# Patient Record
Sex: Male | Born: 1965 | Race: White | Hispanic: No | Marital: Married
Health system: Midwestern US, Academic
[De-identification: ages and names within clinical notes are randomized; demographics above are authoritative.]

---

## 2016-06-23 MED ORDER — AZATHIOPRINE 50 MG PO TAB
ORAL_TABLET | Freq: Every day | 3 refills | Status: DC
Start: 2016-06-23 — End: 2016-10-26

## 2016-07-01 MED ORDER — ADALIMUMAB 40 MG/0.8 ML SC PNKT
40 mg | INJECTION | SUBCUTANEOUS | 5 refills | Status: DC
Start: 2016-07-01 — End: 2016-07-24

## 2016-07-01 MED ORDER — SODIUM CHLORIDE 0.9 % IV SOLP
INTRAVENOUS | 0 refills | Status: CN
Start: 2016-07-01 — End: ?

## 2016-07-01 MED ORDER — PEG-ELECTROLYTE SOLN 420 GRAM PO SOLR
0 refills | Status: DC
Start: 2016-07-01 — End: 2017-01-11

## 2016-07-02 ENCOUNTER — Ambulatory Visit: Admit: 2016-09-01 | Discharge: 2016-09-01 | Payer: BC Managed Care – PPO

## 2016-07-24 MED ORDER — ADALIMUMAB 40 MG/0.8 ML SC PNKT
40 mg | INJECTION | SUBCUTANEOUS | 4 refills | Status: DC
Start: 2016-07-24 — End: 2016-08-04

## 2016-08-04 MED ORDER — ADALIMUMAB 40 MG/0.8 ML SC PNKT
40 mg | INJECTION | SUBCUTANEOUS | 4 refills | Status: DC
Start: 2016-08-04 — End: 2016-12-31

## 2016-08-11 ENCOUNTER — Encounter: Admit: 2016-08-11 | Discharge: 2016-08-11 | Payer: BC Managed Care – PPO

## 2016-09-01 ENCOUNTER — Ambulatory Visit: Admit: 2016-09-01 | Discharge: 2016-09-01 | Payer: BC Managed Care – PPO

## 2016-09-01 ENCOUNTER — Encounter: Admit: 2016-09-01 | Discharge: 2016-09-01 | Payer: BC Managed Care – PPO

## 2016-09-01 DIAGNOSIS — K6389 Other specified diseases of intestine: ICD-10-CM

## 2016-09-01 DIAGNOSIS — Z87891 Personal history of nicotine dependence: ICD-10-CM

## 2016-09-01 DIAGNOSIS — Z980 Intestinal bypass and anastomosis status: ICD-10-CM

## 2016-09-01 DIAGNOSIS — K626 Ulcer of anus and rectum: ICD-10-CM

## 2016-09-01 DIAGNOSIS — K5000 Crohn's disease of small intestine without complications: ICD-10-CM

## 2016-09-01 DIAGNOSIS — K30 Functional dyspepsia: ICD-10-CM

## 2016-09-01 DIAGNOSIS — B029 Zoster without complications: ICD-10-CM

## 2016-09-01 DIAGNOSIS — IMO0002 Abdominal abscess: ICD-10-CM

## 2016-09-01 DIAGNOSIS — R634 Abnormal weight loss: ICD-10-CM

## 2016-09-01 DIAGNOSIS — Z8719 Personal history of other diseases of the digestive system: ICD-10-CM

## 2016-09-01 DIAGNOSIS — K210 Gastro-esophageal reflux disease with esophagitis: ICD-10-CM

## 2016-09-01 DIAGNOSIS — R109 Unspecified abdominal pain: ICD-10-CM

## 2016-09-01 DIAGNOSIS — K449 Diaphragmatic hernia without obstruction or gangrene: Principal | ICD-10-CM

## 2016-09-01 DIAGNOSIS — K5080 Crohn's disease of both small and large intestine without complications: Principal | ICD-10-CM

## 2016-09-01 DIAGNOSIS — E538 Deficiency of other specified B group vitamins: ICD-10-CM

## 2016-09-01 MED ORDER — LACTATED RINGERS IV SOLP
Freq: Once | INTRAVENOUS | 0 refills | Status: DC
Start: 2016-09-01 — End: 2016-09-01

## 2016-09-01 MED ORDER — PROMETHAZINE 25 MG/ML IJ SOLN
6.25 mg | INTRAVENOUS | 0 refills | Status: CN | PRN
Start: 2016-09-01 — End: ?

## 2016-09-01 MED ORDER — PROPOFOL 10 MG/ML IV EMUL 20 ML (INFUSION)(AM)(OR)
INTRAVENOUS | 0 refills | Status: DC
Start: 2016-09-01 — End: 2016-09-01
  Administered 2016-09-01: 18:00:00 120 ug/kg/min via INTRAVENOUS

## 2016-09-01 MED ORDER — MESALAMINE 1,000 MG RE SUPP
1000 mg | Freq: Every evening | RECTAL | 5 refills | 30.00000 days | Status: AC
Start: 2016-09-01 — End: 2018-01-04

## 2016-09-01 MED ORDER — LIDOCAINE (PF) 200 MG/10 ML (2 %) IJ SYRG
0 refills | Status: DC
Start: 2016-09-01 — End: 2016-09-01
  Administered 2016-09-01: 18:00:00 60 mg via INTRAVENOUS

## 2016-09-01 MED ORDER — LACTATED RINGERS IV SOLP
0 refills | Status: DC
Start: 2016-09-01 — End: 2016-09-01
  Administered 2016-09-01: 17:00:00 via INTRAVENOUS

## 2016-09-01 MED ORDER — HYDROCODONE-ACETAMINOPHEN 5-325 MG PO TAB
1-2 | Freq: Once | ORAL | 0 refills | Status: CN | PRN
Start: 2016-09-01 — End: ?

## 2016-09-01 MED ORDER — FENTANYL CITRATE (PF) 50 MCG/ML IJ SOLN
50 ug | INTRAVENOUS | 0 refills | Status: CN | PRN
Start: 2016-09-01 — End: ?

## 2016-09-01 MED ORDER — PROPOFOL INJ 10 MG/ML IV VIAL
0 refills | Status: DC
Start: 2016-09-01 — End: 2016-09-01
  Administered 2016-09-01 (×2): 20 mg via INTRAVENOUS
  Administered 2016-09-01: 18:00:00 60 mg via INTRAVENOUS

## 2016-09-01 NOTE — Telephone Encounter
Need to get him back to see Dr. Celedonio Savage for anal verge ulceration, history Crohn's.  She has seen him before.  I also sent RX for Canasa suppository nightly.  Thanks

## 2016-09-01 NOTE — Anesthesia Post-Procedure Evaluation
Post-Anesthesia Evaluation    Name: Tanner Taylor      MRN: [id number]     DOB: 1965-10-12     Age: 51 y.o.     Sex: male   __________________________________________________________________________     Procedure Date: 09/01/2016  Procedure: Procedure(s):  COLONOSCOPY & EGD regarding GERD & Crohn's  ESOPHAGOGASTRODUODENOSCOPY & Colonoscopy regarding GERD & Crohn's  COLONOSCOPY BIOPSY      Surgeon: Surgeon(s):  Tim Lair, MD    Post-Anesthesia Vitals          Post Anesthesia Evaluation Note    Evaluation location: pre/post  Patient participation: recovered; patient participated in evaluation  Level of consciousness: alert    Pain score: 0  Pain management: adequate    Hydration: normovolemia  Temperature: 36.0C - 38.4C  Airway patency: adequate    Perioperative Events  Perioperative events:  no      Postoperative Status  Cardiovascular status: hemodynamically stable  Respiratory status: spontaneous ventilation  Follow-up needed: none      Perioperative Events  Perioperative Event: No  Emergency Case Activation: No

## 2016-09-01 NOTE — Telephone Encounter
Referral to Dr. Celedonio Savage regarding anal verge ulceration with h/o Crohn's.  Patient has been seen in the past by Dr. Celedonio Savage.  Colonoscopy/EGD report sent to Dr. Alric Seton O'Brien's office.  Please call patient for office visit.

## 2016-09-01 NOTE — Telephone Encounter
Spoke with Dr. Alric Seton O'Brien's Office and they will contact patient regarding appointment.

## 2016-09-03 ENCOUNTER — Encounter: Admit: 2016-09-03 | Discharge: 2016-09-03 | Payer: BC Managed Care – PPO

## 2016-09-03 DIAGNOSIS — K449 Diaphragmatic hernia without obstruction or gangrene: Principal | ICD-10-CM

## 2016-09-03 DIAGNOSIS — IMO0002 Abdominal abscess: ICD-10-CM

## 2016-09-03 DIAGNOSIS — K6389 Other specified diseases of intestine: ICD-10-CM

## 2016-09-03 DIAGNOSIS — R109 Unspecified abdominal pain: ICD-10-CM

## 2016-09-03 DIAGNOSIS — R634 Abnormal weight loss: ICD-10-CM

## 2016-09-03 DIAGNOSIS — B029 Zoster without complications: ICD-10-CM

## 2016-09-03 DIAGNOSIS — E538 Deficiency of other specified B group vitamins: ICD-10-CM

## 2016-09-03 DIAGNOSIS — K5000 Crohn's disease of small intestine without complications: ICD-10-CM

## 2016-09-03 DIAGNOSIS — K210 Gastro-esophageal reflux disease with esophagitis: ICD-10-CM

## 2016-09-24 ENCOUNTER — Encounter: Admit: 2016-09-24 | Discharge: 2016-09-24 | Payer: BC Managed Care – PPO

## 2016-09-24 LAB — CBC AND DIFF
Lab: 0.1 10*3/uL (ref 0.0–0.2)
Lab: 0.1 10*3/uL (ref 0.0–0.7)
Lab: 0.6 10*3/uL (ref 0.0–0.9)
Lab: 0.7 % (ref 0.00–2.50)
Lab: 0.8 % (ref 0.0–7.0)
Lab: 1 10*3/uL (ref 0.60–3.40)
Lab: 12 % (ref 11.6–14.8)
Lab: 13 % — AB (ref 10.0–50.0)
Lab: 13 g/dL — AB (ref 14.0–17.0)
Lab: 246 10*3/uL (ref 150–400)
Lab: 30 pg (ref 27.0–31.2)
Lab: 34 g/dL (ref 32.0–36.0)
Lab: 4.5 M/uL (ref 4.20–5.40)
Lab: 40 % — AB (ref 42.0–52.0)
Lab: 5.6 10*3/uL (ref 2.00–6.90)
Lab: 7.3 10*3/uL (ref 5.00–10.00)
Lab: 76 % — AB (ref 37.0–80.0)
Lab: 8.1 % (ref 0.0–12.0)
Lab: 9.8 fL (ref 7.4–10.0)
Lab: 90 fL (ref 80.0–97.0)

## 2016-10-02 ENCOUNTER — Encounter: Admit: 2016-10-02 | Discharge: 2016-10-02 | Payer: BC Managed Care – PPO

## 2016-10-23 ENCOUNTER — Encounter: Admit: 2016-10-23 | Discharge: 2016-10-23 | Payer: BC Managed Care – PPO

## 2016-10-26 MED ORDER — AZATHIOPRINE 50 MG PO TAB
ORAL_TABLET | Freq: Every day | 3 refills | Status: AC
Start: 2016-10-26 — End: 2017-03-01

## 2016-10-27 ENCOUNTER — Encounter: Admit: 2016-10-27 | Discharge: 2016-10-27 | Payer: BC Managed Care – PPO

## 2016-10-28 ENCOUNTER — Encounter: Admit: 2016-10-28 | Discharge: 2016-10-28 | Payer: BC Managed Care – PPO

## 2016-10-28 LAB — COMPREHENSIVE METABOLIC PANEL
Lab: 103 mmol/L (ref 95–114)
Lab: 138 mmol/L (ref 134–148)
Lab: 4.1 mmol/L (ref 3.5–5.3)

## 2016-10-28 NOTE — Progress Notes
This encounter was created in error. Please disregard.

## 2016-12-08 ENCOUNTER — Encounter: Admit: 2016-12-08 | Discharge: 2016-12-08 | Payer: BC Managed Care – PPO

## 2016-12-08 DIAGNOSIS — Z5181 Encounter for therapeutic drug level monitoring: ICD-10-CM

## 2016-12-08 DIAGNOSIS — K50919 Crohn's disease, unspecified, with unspecified complications: Principal | ICD-10-CM

## 2016-12-08 LAB — CBC AND DIFF
Lab: 0.1 10*3/uL (ref 0.0–0.2)
Lab: 0.3 10*3/uL (ref 0.0–0.7)
Lab: 0.8 % (ref 0.00–2.50)
Lab: 1 10*3/uL — AB (ref 0.0–0.9)
Lab: 10 fL (ref 7.4–10.0)
Lab: 11 % (ref 0.0–12.0)
Lab: 12 % (ref 11.6–14.8)
Lab: 14 g/dL (ref 14.0–17.0)
Lab: 22 % (ref 10.0–50.0)
Lab: 227 10*3/uL (ref 150–400)
Lab: 3 % (ref 0.0–7.0)
Lab: 30 pg (ref 27.0–31.2)
Lab: 33 g/dL (ref 32.0–36.0)
Lab: 4.7 m/uL (ref 4.20–5.40)
Lab: 42 % (ref 42.0–52.0)
Lab: 5.6 10*3/uL (ref 2.00–6.90)
Lab: 62 % (ref 37.0–80.0)
Lab: 8.9 10*3/uL (ref 5.00–10.00)
Lab: 90 fL (ref 80.0–97.0)

## 2016-12-08 NOTE — Telephone Encounter
New standing CBC w/diff and CMP orders faxed to [facility]. [phone]

## 2016-12-31 ENCOUNTER — Encounter: Admit: 2016-12-31 | Discharge: 2016-12-31 | Payer: BC Managed Care – PPO

## 2016-12-31 MED ORDER — ADALIMUMAB 40 MG/0.8 ML SC PNKT
40 mg | INJECTION | SUBCUTANEOUS | 3 refills | Status: AC
Start: 2016-12-31 — End: 2017-08-05

## 2017-01-01 ENCOUNTER — Encounter: Admit: 2017-01-01 | Discharge: 2017-01-01 | Payer: BC Managed Care – PPO

## 2017-01-01 DIAGNOSIS — Z5181 Encounter for therapeutic drug level monitoring: ICD-10-CM

## 2017-01-01 DIAGNOSIS — K50919 Crohn's disease, unspecified, with unspecified complications: Principal | ICD-10-CM

## 2017-01-01 LAB — CBC AND DIFF
Lab: 0.1 10*3/uL (ref 0.0–0.2)
Lab: 0.2 10*3/uL (ref 0.0–0.7)
Lab: 0.4 % (ref 0.00–2.50)
Lab: 1 10*3/uL — AB (ref 0.0–0.9)
Lab: 1.1 10*3/uL (ref 0.60–3.40)
Lab: 1.9 % (ref 0.0–7.0)
Lab: 10 % (ref 10.0–50.0)
Lab: 10 fL (ref 7.4–10.0)
Lab: 11 10*3/uL — AB (ref 5.00–10.00)
Lab: 13 % (ref 11.6–14.8)
Lab: 14 g/dL (ref 14.0–17.0)
Lab: 252 10*3/uL (ref 150–400)
Lab: 30 pg (ref 27.0–31.2)
Lab: 33 g/dL (ref 32.0–36.0)
Lab: 4.6 M/uL (ref 4.2–5.40)
Lab: 41 % — AB (ref 42.0–52.0)
Lab: 78 % (ref 37.0–80.0)
Lab: 8.9 % (ref 0.0–12.0)
Lab: 89 fL (ref 80.0–97.0)
Lab: 9.1 10*3/uL — AB (ref 2.00–6.90)

## 2017-01-11 ENCOUNTER — Encounter: Admit: 2017-01-11 | Discharge: 2017-01-11 | Payer: BC Managed Care – PPO

## 2017-01-11 ENCOUNTER — Ambulatory Visit: Admit: 2017-01-11 | Discharge: 2017-01-12 | Payer: BC Managed Care – PPO

## 2017-01-11 DIAGNOSIS — K50012 Crohn's disease of small intestine with intestinal obstruction: Principal | ICD-10-CM

## 2017-01-11 DIAGNOSIS — R109 Unspecified abdominal pain: ICD-10-CM

## 2017-01-11 DIAGNOSIS — IMO0002 Abdominal abscess: ICD-10-CM

## 2017-01-11 DIAGNOSIS — K210 Gastro-esophageal reflux disease with esophagitis: ICD-10-CM

## 2017-01-11 DIAGNOSIS — E538 Deficiency of other specified B group vitamins: ICD-10-CM

## 2017-01-11 DIAGNOSIS — R634 Abnormal weight loss: ICD-10-CM

## 2017-01-11 DIAGNOSIS — K6389 Other specified diseases of intestine: ICD-10-CM

## 2017-01-11 DIAGNOSIS — B029 Zoster without complications: ICD-10-CM

## 2017-01-11 DIAGNOSIS — K5000 Crohn's disease of small intestine without complications: ICD-10-CM

## 2017-01-11 DIAGNOSIS — K449 Diaphragmatic hernia without obstruction or gangrene: Principal | ICD-10-CM

## 2017-02-02 ENCOUNTER — Encounter: Admit: 2017-02-02 | Discharge: 2017-02-02 | Payer: BC Managed Care – PPO

## 2017-02-02 LAB — CBC AND DIFF
Lab: 0.1 10*3/uL (ref 0.0–0.2)
Lab: 0.4 10*3/uL (ref 0.0–0.7)
Lab: 0.5 % (ref 0.00–2.50)
Lab: 0.7 10*3/uL (ref 0.0–0.9)
Lab: 1.2 10*3/uL (ref 0.60–3.40)
Lab: 10 fL — AB (ref 7.4–10.0)
Lab: 13 % (ref 10.0–50.0)
Lab: 13 % (ref 11.6–14.8)
Lab: 14 g/dL (ref 14.0–17.0)
Lab: 264 10*3/uL (ref 150–400)
Lab: 3.8 % (ref 0.0–7.0)
Lab: 30 pg (ref 27.0–31.2)
Lab: 33 g/dL (ref 32.0–36.0)
Lab: 4.7 M/uL (ref 4.20–5.40)
Lab: 43 % (ref 42.0–52.0)
Lab: 6.9 10*3/uL — AB (ref 2.0–6.90)
Lab: 7.1 % (ref 0.0–12.0)
Lab: 75 % (ref 37.0–80.0)
Lab: 9.1 10*3/uL (ref 5.00–10.20)
Lab: 91 fL (ref 80.0–97.0)

## 2017-02-02 LAB — COMPREHENSIVE METABOLIC PANEL
Lab: 1.1 mg/dL (ref 0.50–1.50)
Lab: 10 mg/dL (ref 5–25)
Lab: 108 mmol/L (ref 95–114)
Lab: 143 mmol/L (ref 134–148)
Lab: 19 U/L (ref 2–40)
Lab: 22 U/L (ref 6–45)
Lab: 25 meq/L (ref 22–33)
Lab: 4 mmol/L (ref 3.5–5.3)
Lab: 86 mg/dL (ref 70–110)
Lab: 9.4 mg/dL (ref 8.3–10.4)
Lab: 90 U/L (ref 35–130)

## 2017-03-01 ENCOUNTER — Encounter: Admit: 2017-03-01 | Discharge: 2017-03-01 | Payer: BC Managed Care – PPO

## 2017-03-01 MED ORDER — AZATHIOPRINE 50 MG PO TAB
ORAL_TABLET | Freq: Every day | 3 refills | Status: AC
Start: 2017-03-01 — End: 2017-07-05

## 2017-03-05 ENCOUNTER — Encounter: Admit: 2017-03-05 | Discharge: 2017-03-05 | Payer: BC Managed Care – PPO

## 2017-03-05 MED ORDER — DOXYCYCLINE HYCLATE 100 MG PO CAP
100 mg | ORAL_CAPSULE | Freq: Two times a day (BID) | ORAL | 0 refills | 8.00000 days | Status: AC
Start: 2017-03-05 — End: ?

## 2017-03-05 NOTE — Telephone Encounter
Wife's message to our office is that patient feels needs another round of doxycycline as previously prescribed and was told to call Dr. Jomarie Longs for refill.  Routed refill to Dr. Jomarie Longs

## 2017-03-05 NOTE — Telephone Encounter
Review of 03/05/2017 CBC w/diff results with patient.  Sent to be entered and routed to Dr. Jomarie Longs.

## 2017-03-08 ENCOUNTER — Encounter: Admit: 2017-03-08 | Discharge: 2017-03-08 | Payer: BC Managed Care – PPO

## 2017-03-08 DIAGNOSIS — Z5181 Encounter for therapeutic drug level monitoring: ICD-10-CM

## 2017-03-08 DIAGNOSIS — K50919 Crohn's disease, unspecified, with unspecified complications: Principal | ICD-10-CM

## 2017-03-08 LAB — CBC AND DIFF
Lab: 0 10*3/uL (ref 0.0–0.2)
Lab: 0.3 % (ref 0.00–2.50)
Lab: 0.3 10*3/uL (ref 0.0–0.7)
Lab: 0.9 10*3/uL (ref 0.0–0.9)
Lab: 1 10*3/uL (ref 0.60–3.40)
Lab: 10 % (ref 0.0–12.0)
Lab: 10 fL — AB (ref 7.4–10.00)
Lab: 12 % (ref 10.0–50.0)
Lab: 14 g/dL (ref 14.0–17.0)
Lab: 2.9 % (ref 0.0–7.0)
Lab: 30 pg (ref 27.0–31.2)
Lab: 33 g/dL (ref 32.0–36.0)
Lab: 4.8 M/uL (ref 4.20–5.40)
Lab: 43 % (ref 42.0–52.0)
Lab: 6.4 10*3/uL (ref 2.00–6.90)
Lab: 74 % (ref 37.0–80.0)
Lab: 8.7 10*3/uL (ref 5.00–10.00)
Lab: 90 fL (ref 80.0–97.0)

## 2017-03-10 ENCOUNTER — Encounter: Admit: 2017-03-10 | Discharge: 2017-03-10 | Payer: BC Managed Care – PPO

## 2017-03-10 DIAGNOSIS — Z5181 Encounter for therapeutic drug level monitoring: ICD-10-CM

## 2017-03-10 DIAGNOSIS — K50919 Crohn's disease, unspecified, with unspecified complications: Principal | ICD-10-CM

## 2017-03-10 LAB — COMPREHENSIVE METABOLIC PANEL
Lab: 0.5
Lab: 10
Lab: 104
Lab: 13
Lab: 20
Lab: 23
Lab: 3.8
Lab: 1
Lab: 13
Lab: 135 — ABNORMAL HIGH
Lab: 25
Lab: 4.6
Lab: 7.5
Lab: 86
Lab: 9.4

## 2017-03-15 ENCOUNTER — Encounter: Admit: 2017-03-15 | Discharge: 2017-03-15 | Payer: BC Managed Care – PPO

## 2017-04-27 ENCOUNTER — Encounter: Admit: 2017-04-27 | Discharge: 2017-04-27 | Payer: BC Managed Care – PPO

## 2017-04-27 DIAGNOSIS — Z5181 Encounter for therapeutic drug level monitoring: ICD-10-CM

## 2017-04-27 DIAGNOSIS — K50919 Crohn's disease, unspecified, with unspecified complications: Principal | ICD-10-CM

## 2017-04-27 LAB — CBC AND DIFF
Lab: 0 10*3/uL (ref 0.0–0.2)
Lab: 0.1 10*3/uL (ref 0.0–0.7)
Lab: 0.4 % (ref 0.00–2.50)
Lab: 0.6 10*3/uL (ref 0.0–0.9)
Lab: 1.3 % (ref 0.0–7.0)
Lab: 1.4 10*3/uL (ref 0.60–3.40)
Lab: 10 fL — AB (ref 7.4–10.0)
Lab: 13 % (ref 11.6–14.8)
Lab: 14 g/dL (ref 14.0–17.0)
Lab: 18 % (ref 10.0–50.0)
Lab: 18 % — AB (ref 10.00–50.00)
Lab: 267 10*3/uL (ref 150–400)
Lab: 31 pg — AB (ref 27.0–31.2)
Lab: 34 g/dL (ref 32.0–36.0)
Lab: 4.6 M/uL (ref 4.20–5.40)
Lab: 42 % (ref 42.0–52.0)
Lab: 5.9 10*3/uL (ref 2.00–6.90)
Lab: 7.3 % (ref 0.0–12.0)
Lab: 72 % (ref 37.0–80.0)
Lab: 8.1 10*3/uL (ref 5.00–10.00)
Lab: 92 fL (ref 80.0–97.0)

## 2017-06-07 LAB — COMPREHENSIVE METABOLIC PANEL
Lab: 13 (ref 6–14)
Lab: 291 (ref 280–295)
Lab: 0.4 mg/dL (ref 0.2–1.2)
Lab: 108 mmol/L (ref 95–114)
Lab: 141 mmol/L (ref 134–148)
Lab: 17 U/L (ref 6–45)
Lab: 18 U/L (ref 2–40)
Lab: 2.6 g/dL (ref 2.3–3.5)
Lab: 4 mmol/L (ref 3.5–5.3)
Lab: 4.2 g/dL (ref 3.6–5.1)
Lab: 6.8 g/dL (ref 6.0–8.3)
Lab: 75 mL/min/{1.73_m2} (ref >59)
Lab: 9.7 mg/dL (ref 8.3–10.4)
Lab: 92 U/L (ref 35–130)

## 2017-06-10 ENCOUNTER — Encounter: Admit: 2017-06-10 | Discharge: 2017-06-10 | Payer: BC Managed Care – PPO

## 2017-06-10 DIAGNOSIS — IMO0002 Abdominal abscess: ICD-10-CM

## 2017-06-10 DIAGNOSIS — B029 Zoster without complications: ICD-10-CM

## 2017-06-10 DIAGNOSIS — E538 Deficiency of other specified B group vitamins: ICD-10-CM

## 2017-06-10 DIAGNOSIS — K449 Diaphragmatic hernia without obstruction or gangrene: Principal | ICD-10-CM

## 2017-06-10 DIAGNOSIS — K5000 Crohn's disease of small intestine without complications: ICD-10-CM

## 2017-06-10 DIAGNOSIS — R634 Abnormal weight loss: ICD-10-CM

## 2017-06-10 DIAGNOSIS — K210 Gastro-esophageal reflux disease with esophagitis: ICD-10-CM

## 2017-06-10 DIAGNOSIS — K6389 Other specified diseases of intestine: ICD-10-CM

## 2017-06-10 DIAGNOSIS — R109 Unspecified abdominal pain: ICD-10-CM

## 2017-06-17 ENCOUNTER — Encounter: Admit: 2017-06-17 | Discharge: 2017-06-17 | Payer: BC Managed Care – PPO

## 2017-06-17 DIAGNOSIS — Z5181 Encounter for therapeutic drug level monitoring: ICD-10-CM

## 2017-06-17 DIAGNOSIS — K50919 Crohn's disease, unspecified, with unspecified complications: Principal | ICD-10-CM

## 2017-06-24 ENCOUNTER — Encounter: Admit: 2017-06-24 | Discharge: 2017-06-24 | Payer: BC Managed Care – PPO

## 2017-06-24 ENCOUNTER — Ambulatory Visit: Admit: 2017-06-24 | Discharge: 2017-06-24 | Payer: BC Managed Care – PPO

## 2017-06-24 DIAGNOSIS — K449 Diaphragmatic hernia without obstruction or gangrene: Principal | ICD-10-CM

## 2017-06-24 DIAGNOSIS — B029 Zoster without complications: ICD-10-CM

## 2017-06-24 DIAGNOSIS — E538 Deficiency of other specified B group vitamins: ICD-10-CM

## 2017-06-24 DIAGNOSIS — K50012 Crohn's disease of small intestine with intestinal obstruction: Principal | ICD-10-CM

## 2017-06-24 DIAGNOSIS — IMO0002 Abdominal abscess: ICD-10-CM

## 2017-06-24 DIAGNOSIS — R109 Unspecified abdominal pain: ICD-10-CM

## 2017-06-24 DIAGNOSIS — Z5181 Encounter for therapeutic drug level monitoring: ICD-10-CM

## 2017-06-24 DIAGNOSIS — K210 Gastro-esophageal reflux disease with esophagitis: ICD-10-CM

## 2017-06-24 DIAGNOSIS — R634 Abnormal weight loss: ICD-10-CM

## 2017-06-24 DIAGNOSIS — N200 Calculus of kidney: ICD-10-CM

## 2017-06-24 DIAGNOSIS — K6389 Other specified diseases of intestine: ICD-10-CM

## 2017-06-24 DIAGNOSIS — K5000 Crohn's disease of small intestine without complications: ICD-10-CM

## 2017-07-05 ENCOUNTER — Encounter: Admit: 2017-07-05 | Discharge: 2017-07-05 | Payer: BC Managed Care – PPO

## 2017-07-05 MED ORDER — AZATHIOPRINE 50 MG PO TAB
ORAL_TABLET | Freq: Every day | 3 refills | Status: AC
Start: 2017-07-05 — End: 2017-10-26

## 2017-07-31 LAB — CBC AND DIFF
Lab: 0 10*3/uL (ref 0.0–0.2)
Lab: 0.1 10*3/uL (ref 0.0–0.7)
Lab: 0.4 % (ref 0.00–2.50)
Lab: 0.6 10*3/uL (ref 0.0–0.9)
Lab: 1.2 10*3/uL (ref 0.60–3.40)
Lab: 1.4 % (ref 0.0–7.0)
Lab: 10 fL — AB (ref 7.4–10.0)
Lab: 13 % (ref 11.6–14.8)
Lab: 16 % (ref 10.0–50.0)
Lab: 31 pg (ref 27.0–31.2)
Lab: 34 g/dL (ref 32.0–36.0)
Lab: 5.2 10*3/uL (ref 2.00–6.90)
Lab: 7.7 % (ref 0.0–12.0)
Lab: 73 % (ref 37.0–80.0)

## 2017-08-03 ENCOUNTER — Encounter: Admit: 2017-08-03 | Discharge: 2017-08-03 | Payer: BC Managed Care – PPO

## 2017-08-05 ENCOUNTER — Encounter: Admit: 2017-08-05 | Discharge: 2017-08-05 | Payer: BC Managed Care – PPO

## 2017-08-05 MED ORDER — ADALIMUMAB 40 MG/0.8 ML SC PNKT
40 mg | INJECTION | SUBCUTANEOUS | 3 refills | Status: AC
Start: 2017-08-05 — End: 2018-09-01

## 2017-08-05 MED ORDER — ADALIMUMAB 40 MG/0.8 ML SC PNKT
40 mg | INJECTION | SUBCUTANEOUS | 3 refills | Status: AC
Start: 2017-08-05 — End: 2017-08-05

## 2017-08-07 ENCOUNTER — Encounter: Admit: 2017-08-07 | Discharge: 2017-08-07 | Payer: BC Managed Care – PPO

## 2017-08-09 ENCOUNTER — Encounter: Admit: 2017-08-09 | Discharge: 2017-08-09 | Payer: BC Managed Care – PPO

## 2017-09-24 LAB — CBC AND DIFF
Lab: 13 g/dL — AB (ref 14.0–17.0)
Lab: 4.3 M/uL (ref 4.20–5.40)
Lab: 40 % — AB (ref 42.0–52.0)
Lab: 9 10*3/uL (ref 3.00–10.00)

## 2017-10-05 ENCOUNTER — Encounter: Admit: 2017-10-05 | Discharge: 2017-10-05 | Payer: BC Managed Care – PPO

## 2017-10-12 ENCOUNTER — Encounter: Admit: 2017-10-12 | Discharge: 2017-10-12 | Payer: BC Managed Care – PPO

## 2017-10-12 DIAGNOSIS — Z5181 Encounter for therapeutic drug level monitoring: ICD-10-CM

## 2017-10-12 DIAGNOSIS — K50919 Crohn's disease, unspecified, with unspecified complications: Principal | ICD-10-CM

## 2017-10-25 ENCOUNTER — Encounter: Admit: 2017-10-25 | Discharge: 2017-10-25 | Payer: BC Managed Care – PPO

## 2017-10-26 MED ORDER — AZATHIOPRINE 50 MG PO TAB
ORAL_TABLET | Freq: Every day | 3 refills | Status: AC
Start: 2017-10-26 — End: ?

## 2017-11-29 ENCOUNTER — Encounter: Admit: 2017-11-29 | Discharge: 2017-11-29 | Payer: BC Managed Care – PPO

## 2017-11-29 DIAGNOSIS — Z5181 Encounter for therapeutic drug level monitoring: ICD-10-CM

## 2017-11-29 DIAGNOSIS — K50919 Crohn's disease, unspecified, with unspecified complications: Principal | ICD-10-CM

## 2017-12-08 ENCOUNTER — Encounter: Admit: 2017-12-08 | Discharge: 2017-12-08 | Payer: BC Managed Care – PPO

## 2017-12-08 ENCOUNTER — Ambulatory Visit: Admit: 2017-12-08 | Discharge: 2017-12-09 | Payer: BC Managed Care – PPO

## 2017-12-08 DIAGNOSIS — B029 Zoster without complications: ICD-10-CM

## 2017-12-08 DIAGNOSIS — K210 Gastro-esophageal reflux disease with esophagitis: ICD-10-CM

## 2017-12-08 DIAGNOSIS — R634 Abnormal weight loss: ICD-10-CM

## 2017-12-08 DIAGNOSIS — K5090 Crohn's disease, unspecified, without complications: Principal | ICD-10-CM

## 2017-12-08 DIAGNOSIS — E538 Deficiency of other specified B group vitamins: ICD-10-CM

## 2017-12-08 DIAGNOSIS — K50012 Crohn's disease of small intestine with intestinal obstruction: ICD-10-CM

## 2017-12-08 DIAGNOSIS — IMO0002 Abdominal abscess: ICD-10-CM

## 2017-12-08 DIAGNOSIS — D899 Disorder involving the immune mechanism, unspecified: ICD-10-CM

## 2017-12-08 DIAGNOSIS — K6389 Other specified diseases of intestine: ICD-10-CM

## 2017-12-08 DIAGNOSIS — K5000 Crohn's disease of small intestine without complications: ICD-10-CM

## 2017-12-08 DIAGNOSIS — R109 Unspecified abdominal pain: ICD-10-CM

## 2017-12-08 DIAGNOSIS — K50919 Crohn's disease, unspecified, with unspecified complications: Principal | ICD-10-CM

## 2017-12-08 DIAGNOSIS — K449 Diaphragmatic hernia without obstruction or gangrene: Principal | ICD-10-CM

## 2017-12-08 DIAGNOSIS — Z5181 Encounter for therapeutic drug level monitoring: ICD-10-CM

## 2017-12-08 MED ORDER — PEG-ELECTROLYTE SOLN 420 GRAM PO SOLR
0 refills | Status: AC
Start: 2017-12-08 — End: ?

## 2018-01-04 ENCOUNTER — Encounter: Admit: 2018-01-04 | Discharge: 2018-01-04 | Payer: BC Managed Care – PPO

## 2018-01-04 DIAGNOSIS — K210 Gastro-esophageal reflux disease with esophagitis: ICD-10-CM

## 2018-01-04 DIAGNOSIS — IMO0002 Abdominal abscess: ICD-10-CM

## 2018-01-04 DIAGNOSIS — R634 Abnormal weight loss: ICD-10-CM

## 2018-01-04 DIAGNOSIS — R109 Unspecified abdominal pain: ICD-10-CM

## 2018-01-04 DIAGNOSIS — B029 Zoster without complications: ICD-10-CM

## 2018-01-04 DIAGNOSIS — K5000 Crohn's disease of small intestine without complications: ICD-10-CM

## 2018-01-04 DIAGNOSIS — E538 Deficiency of other specified B group vitamins: ICD-10-CM

## 2018-01-04 DIAGNOSIS — K6389 Other specified diseases of intestine: ICD-10-CM

## 2018-01-04 DIAGNOSIS — K449 Diaphragmatic hernia without obstruction or gangrene: Principal | ICD-10-CM

## 2018-01-10 ENCOUNTER — Encounter: Admit: 2018-01-10 | Discharge: 2018-01-10 | Payer: BC Managed Care – PPO

## 2018-01-10 ENCOUNTER — Ambulatory Visit: Admit: 2018-01-10 | Discharge: 2018-01-11 | Payer: BC Managed Care – PPO

## 2018-01-10 ENCOUNTER — Ambulatory Visit: Admit: 2018-01-10 | Discharge: 2018-01-10 | Payer: BC Managed Care – PPO

## 2018-01-10 DIAGNOSIS — K449 Diaphragmatic hernia without obstruction or gangrene: Principal | ICD-10-CM

## 2018-01-10 DIAGNOSIS — B029 Zoster without complications: ICD-10-CM

## 2018-01-10 DIAGNOSIS — K6389 Other specified diseases of intestine: ICD-10-CM

## 2018-01-10 DIAGNOSIS — K210 Gastro-esophageal reflux disease with esophagitis: ICD-10-CM

## 2018-01-10 DIAGNOSIS — E538 Deficiency of other specified B group vitamins: ICD-10-CM

## 2018-01-10 DIAGNOSIS — K5000 Crohn's disease of small intestine without complications: ICD-10-CM

## 2018-01-10 DIAGNOSIS — R634 Abnormal weight loss: ICD-10-CM

## 2018-01-10 DIAGNOSIS — IMO0002 Abdominal abscess: ICD-10-CM

## 2018-01-10 DIAGNOSIS — R109 Unspecified abdominal pain: ICD-10-CM

## 2018-01-10 MED ORDER — SIMETHICONE 40 MG/0.6 ML PO DRPS
0 refills | Status: DC
Start: 2018-01-10 — End: 2018-01-15

## 2018-01-10 MED ORDER — LACTATED RINGERS IV SOLP
1000 mL | INTRAVENOUS | 0 refills | Status: DC
Start: 2018-01-10 — End: 2018-01-15

## 2018-01-10 MED ORDER — PROPOFOL 10 MG/ML IV EMUL 50 ML (INFUSION)(AM)(OR)
INTRAVENOUS | 0 refills | Status: DC
Start: 2018-01-10 — End: 2018-01-10

## 2018-01-10 MED ORDER — LIDOCAINE (PF) 10 MG/ML (1 %) IJ SOLN
.1-2 mL | INTRAMUSCULAR | 0 refills | Status: DC | PRN
Start: 2018-01-10 — End: 2018-01-15

## 2018-01-11 ENCOUNTER — Encounter: Admit: 2018-01-11 | Discharge: 2018-01-11 | Payer: BC Managed Care – PPO

## 2018-01-11 DIAGNOSIS — K6389 Other specified diseases of intestine: ICD-10-CM

## 2018-01-11 DIAGNOSIS — K210 Gastro-esophageal reflux disease with esophagitis: ICD-10-CM

## 2018-01-11 DIAGNOSIS — R634 Abnormal weight loss: ICD-10-CM

## 2018-01-11 DIAGNOSIS — K449 Diaphragmatic hernia without obstruction or gangrene: Principal | ICD-10-CM

## 2018-01-11 DIAGNOSIS — B029 Zoster without complications: ICD-10-CM

## 2018-01-11 DIAGNOSIS — IMO0002 Abdominal abscess: ICD-10-CM

## 2018-01-11 DIAGNOSIS — K5000 Crohn's disease of small intestine without complications: ICD-10-CM

## 2018-01-11 DIAGNOSIS — E538 Deficiency of other specified B group vitamins: ICD-10-CM

## 2018-01-11 DIAGNOSIS — R109 Unspecified abdominal pain: ICD-10-CM

## 2018-01-19 ENCOUNTER — Encounter: Admit: 2018-01-19 | Discharge: 2018-01-19 | Payer: BC Managed Care – PPO

## 2018-01-19 DIAGNOSIS — K50919 Crohn's disease, unspecified, with unspecified complications: Principal | ICD-10-CM

## 2018-01-19 DIAGNOSIS — Z79899 Other long term (current) drug therapy: ICD-10-CM

## 2018-03-23 ENCOUNTER — Encounter: Admit: 2018-03-23 | Discharge: 2018-03-23 | Payer: BC Managed Care – PPO

## 2018-05-11 ENCOUNTER — Encounter: Admit: 2018-05-11 | Discharge: 2018-05-11 | Payer: BC Managed Care – PPO

## 2018-05-11 NOTE — Telephone Encounter
Diagnosed with Type A influenza on 05/06/2018 and Wife held scheduled Humira injection on 05/08/2018 asking when able to give.  Was prescribed antibiotics as well.  Attempt to contact Wife as would like to know if febrile or how long has been afebrile and how is patient feeling and s/s of infection.

## 2018-05-12 NOTE — Telephone Encounter
Wife updates no fever since Sunday morning.

## 2018-05-25 ENCOUNTER — Encounter: Admit: 2018-05-25 | Discharge: 2018-05-25 | Payer: BC Managed Care – PPO

## 2018-08-26 NOTE — Telephone Encounter
Spouse LVM inquiring they would like to do telehealth instead of coming in due to the COVID19. LVM for wife back to give her number to reschedule apt to telehealth.left call back number

## 2018-09-01 ENCOUNTER — Encounter: Admit: 2018-09-01 | Discharge: 2018-09-01

## 2018-09-01 MED ORDER — ADALIMUMAB 40 MG/0.8 ML SC PNKT
40 mg | SUBCUTANEOUS | 11 refills | Status: DC
Start: 2018-09-01 — End: 2018-12-08

## 2018-09-19 ENCOUNTER — Encounter: Admit: 2018-09-19 | Discharge: 2018-09-19

## 2018-10-31 ENCOUNTER — Encounter: Admit: 2018-10-31 | Discharge: 2018-10-31

## 2018-10-31 ENCOUNTER — Ambulatory Visit: Admit: 2018-10-31 | Discharge: 2018-11-01

## 2018-10-31 DIAGNOSIS — R109 Unspecified abdominal pain: Secondary | ICD-10-CM

## 2018-10-31 DIAGNOSIS — E538 Deficiency of other specified B group vitamins: Secondary | ICD-10-CM

## 2018-10-31 DIAGNOSIS — R634 Abnormal weight loss: Secondary | ICD-10-CM

## 2018-10-31 DIAGNOSIS — K210 Gastro-esophageal reflux disease with esophagitis: Secondary | ICD-10-CM

## 2018-10-31 DIAGNOSIS — K449 Diaphragmatic hernia without obstruction or gangrene: Secondary | ICD-10-CM

## 2018-10-31 DIAGNOSIS — K6389 Other specified diseases of intestine: Secondary | ICD-10-CM

## 2018-10-31 DIAGNOSIS — K50012 Crohn's disease of small intestine with intestinal obstruction: Secondary | ICD-10-CM

## 2018-10-31 DIAGNOSIS — IMO0002 Abdominal abscess: Secondary | ICD-10-CM

## 2018-10-31 DIAGNOSIS — B029 Zoster without complications: Secondary | ICD-10-CM

## 2018-10-31 DIAGNOSIS — Z79899 Other long term (current) drug therapy: Secondary | ICD-10-CM

## 2018-10-31 DIAGNOSIS — K5000 Crohn's disease of small intestine without complications: Secondary | ICD-10-CM

## 2018-10-31 NOTE — Progress Notes
Telehealth Visit Note    Date of Service: 10/31/2018    Subjective:      Obtained patient's verbal consent to treat them and their agreement to [facility] financial policy and NPP via this telehealth visit during the [facility] Emergency       Tanner Taylor is a 53 y.o. male.    History of Present Illness    I last saw???Tanner Taylor???on???12/08/2017. 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 He has been maintained on Humira 40 mg every two weeks (started in October 2012) and azathioprine 100 mg daily.  Colonoscopy performed 01/10/2018 revealed some angulation in the distal sigmoid colon, but had normal colonic mucosa throughout, with only superficial erosions at the anastomotic line only with a normal neoterminal ileum (Rutgeerts i0). ???    After his colonoscopy we had him discontinue azathioprine and he now is on Humira monotherapy.  ???  He???continues to do???well from a GI standpoint. ???Doxycycline in December 2018 for small bowel bacterial overgrowth which significantly improved his abdominal bloating, gassiness, and borborygmi. ???He continues to have a bowel movement in the postprandial setting. ???He denies any blood in the stool.  Stress will cause more frequent stools.    ???  He???denies fevers, chills, night sweats, or any swollen lymph nodes or glands. He denies eye or skin symptoms. He has some occasional joint discomfort.  ???  He is tolerating Humira and azathioprine without difficulties. CBC has been within normal limits, although last CBC in July revealed mild anemia. He remains on B12 injections. ???  ??????  IBD Health Maintenance:  1. Influenza vaccine:???12/2017  2. Pneumonia vaccine:???12/2016  3. Dermatology: 2019   4. Gyne: NA  5. Bone density: no  6. Tobacco use: no  7. Surveillance colonoscopy:???09/01/2016  8. Biologic use:???Humira (October 2012)  ???  PAST MEDICAL HISTORY: 1. 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  2. 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  3. Anal ulcer, chronic (O'Brien 01/14/2017)  4. History of herpes zoster, resolved.  5. Past history of small bowel bacterial overgrowth prior to surgery.??? Xifaxin December 2015, Doxycycline 2017.  6. Vitamin B12 deficiency, on replacement therapy.  7. Prior immunosuppressive therapy, azathioprine, discontinued November 2019.  8. Chrnonic biologic therapy, Humira (started October 2012).  9. TB spot negative (12/03/10).  10. Pneumonia vaccine 2013.  11. Influenza vaccine 2014.  ???  FAMILY HISTORY:  No IBD, CRC.  ???  SOCIAL HISTORY:  Glass blower/designer.???Daughter is graduating, going to go to [employer], mathematics.  His wife works at [employer] and is Theatre stage manager as well.       Review of Systems  Please see HPI.    Objective:         ??? adalimumab (HUMIRA PEN) 40 mg/0.8 mL injection pen Inject 0.8 mL under the skin every 14 days. Indications: Crohn's disease   ??? azaTHIOprine (IMURAN) 50 mg tablet TAKE 2 TABLETS BY MOUTH ONCE DAILY   ??? cyanocobalamin (VITAMIN B-12, RUBRAMIN) 1,000 mcg/mL injection Inject 1,000 mcg to area(s) as directed every 30 days.   ??? electrolyte GUT PEG (NULYTELY, COLYTE, GAVILYTE-N) 420 gram oral solution Split dose   ??? ferrous sulfate (FEOSOL, FEROSUL) 325 mg (65 mg iron) tablet Take 325 mg by mouth daily. Take on an empty stomach at  least 1 hour before or 2 hours after food.   ??? L.acid/L.casei/B.bif/B.lon/FOS (PROBIOTIC BLEND PO) Take 1 capsule by mouth daily.   ??? MULTIVITS W-FE,OTHER MIN (CENTRUM PO) Take 1 Tab by mouth Daily.     There were no vitals filed for this visit.  There is no height or weight on file to calculate BMI.         Physical Exam  Physical Exam  Vitals signs reviewed.   Constitutional:       Appearance: Normal appearance.   HENT:      Head: Normocephalic and atraumatic. Pulmonary:      Effort: No respiratory distress.   Skin:     Coloration: Skin is not jaundiced.   Neurological:      Mental Status: She is alert and oriented to person, place, and time.   Psychiatric:         Mood and Affect: Mood normal.         Behavior: Behavior normal.         Thought Content: Thought content normal.         Judgment: Judgment normal.        Assessment and Plan:  1. Crohn's disease, status post ileocecal resection  ??? initial end ileostomy and Hartmann pouch on January 02, 2009.  ??? Ileostomy takedown and reanastomosis, August 13, 2009.  ??? Recurrent postsurgical Crohn's disease Rutgeerts i1 (12/25/14; 09/01/16).  2. Chronic biologic therapy, adalimumab (started [facility] 2012).  3. Previous chronic immunosuppressive therapy, azathioprine, discontinued November 2019.  4. Chronic anal ulcer (O'Brien 01/14/2017).  5. IDA, intolerant of oral iron.  6. History of herpes zoster, resolved.  7. Past history of small bowel bacterial overgrowth.???   8. Vitamin B12 deficiency, on replacement therapy.  9. Kidney stones.  ???  He is doing well from a GI standpoint.  Colonoscopy on 01/10/2018 revealed only superficial ulceration at the anastomosis itself with otherwise normal neoterminal ileal mucosa and colonic mucosa.  He had scarring in the anal canal.  Given his history of skin cancers and his endoscopic findings, we discontinued azathioprine and are now continuing monotherapy with Humira only.    ???  We discussed the importance of yearly Dermatology skin cancer check. ???He is up-to-date on influenza and pneumococcal vaccine.??????  ???  He has a good understanding of the potential risk of chronic biologic therapy and the need to seek medical attention.  We discussed COVID-19 precautions as well.??? ???  ???  At the present time, I recommend the following:???  1. Humira 40 mg every 2 weeks.  2. 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  3. Continue monthly B12 replacement. 4. Continue with monthly CBC, CMP every 3 months.   5. Avoid nonsteroidal anti-inflammatory medications.  6. Follow up with me in the GI clinic in 4-6 months or sooner if need be.  ???  I had a very long discussion and all questions were answered. ???I have asked them to continue to call about a week after obtaining lab draws to review the results. ???  ???  ???25 minutes total time spent on this patient's encounter with counseling and coordination of care taking >50% of the visit.

## 2018-12-08 ENCOUNTER — Encounter: Admit: 2018-12-08 | Discharge: 2018-12-08 | Payer: BC Managed Care – PPO

## 2018-12-08 MED ORDER — ADALIMUMAB 40 MG/0.8 ML SC PNKT
40 mg | SUBCUTANEOUS | 11 refills | Status: DC
Start: 2018-12-08 — End: 2018-12-08

## 2018-12-08 MED ORDER — ADALIMUMAB 40 MG/0.8 ML SC PNKT
40 mg | SUBCUTANEOUS | 11 refills | Status: AC
Start: 2018-12-08 — End: ?

## 2018-12-08 NOTE — Progress Notes
The Prior Authorization for Humira was submitted for Tanner Taylor via CMM.  Will continue to follow.    [facility]  [phone]

## 2018-12-08 NOTE — Progress Notes
Tanner Taylor's specialty medication Humira has been approved but is mandated to [place] by their insurance. A new prescription has been sent and will be routed to provider for cosignature.    Virgilio Belling, PHARMD

## 2018-12-08 NOTE — Telephone Encounter
Message from Wife, patient is out of Humira and requesting our office to provide prior authorization for insurance approval.  Routing prescription to our [place] to further assist.

## 2018-12-08 NOTE — Progress Notes
The Prior Authorization for Humira was approved for Tanner Taylor from 12/08/18 to 12/08/19. The PA authorization number is [id number].      [facility]  [phone]

## 2018-12-09 ENCOUNTER — Encounter: Admit: 2018-12-09 | Discharge: 2018-12-09 | Payer: BC Managed Care – PPO

## 2018-12-09 NOTE — Telephone Encounter
Called and spoke with representative at [facility]. They stated that [facility] medication, Humira, is currently processing and should be ready to ship out today.  Attempted to contact patient to relay status to them. Unable to reach. LVM requesting call back to clinic pharmacist at [phone] for further information.    Virgilio Belling, PHARMD

## 2018-12-21 ENCOUNTER — Encounter: Admit: 2018-12-21 | Discharge: 2018-12-21 | Payer: BC Managed Care – PPO

## 2018-12-21 DIAGNOSIS — Z79899 Other long term (current) drug therapy: Secondary | ICD-10-CM

## 2018-12-21 DIAGNOSIS — K50919 Crohn's disease, unspecified, with unspecified complications: Secondary | ICD-10-CM

## 2018-12-21 LAB — CBC AND DIFF
Lab: 0
Lab: 0.3
Lab: 0.4
Lab: 1 — ABNORMAL HIGH
Lab: 1.6
Lab: 10
Lab: 10 — ABNORMAL HIGH
Lab: 12
Lab: 14
Lab: 17
Lab: 238
Lab: 3.2
Lab: 30
Lab: 33
Lab: 4.8
Lab: 43
Lab: 6.2
Lab: 67
Lab: 9.2
Lab: 90

## 2018-12-21 LAB — COMPREHENSIVE METABOLIC PANEL
Lab: 0.4
Lab: 1
Lab: 101
Lab: 106
Lab: 13
Lab: 13
Lab: 140
Lab: 19
Lab: 25
Lab: 31
Lab: 4.1
Lab: 4.2
Lab: 6.7
Lab: 76
Lab: 9
Lab: 93

## 2018-12-21 NOTE — Telephone Encounter
12/17/2018 standing order lab results sent to Dr. Clide Cliff for final review.  Sent to be entered into chart for trending purposes.

## 2019-02-13 ENCOUNTER — Encounter: Admit: 2019-02-13 | Discharge: 2019-02-13 | Payer: BC Managed Care – PPO

## 2019-02-13 DIAGNOSIS — K5000 Crohn's disease of small intestine without complications: Secondary | ICD-10-CM

## 2019-02-13 DIAGNOSIS — E538 Deficiency of other specified B group vitamins: Secondary | ICD-10-CM

## 2019-02-13 DIAGNOSIS — IMO0002 Abdominal abscess: Secondary | ICD-10-CM

## 2019-02-13 DIAGNOSIS — R109 Unspecified abdominal pain: Secondary | ICD-10-CM

## 2019-02-13 DIAGNOSIS — K21 Reflux esophagitis: Secondary | ICD-10-CM

## 2019-02-13 DIAGNOSIS — K6389 Other specified diseases of intestine: Secondary | ICD-10-CM

## 2019-02-13 DIAGNOSIS — R634 Abnormal weight loss: Secondary | ICD-10-CM

## 2019-02-13 DIAGNOSIS — K449 Diaphragmatic hernia without obstruction or gangrene: Secondary | ICD-10-CM

## 2019-02-13 DIAGNOSIS — B029 Zoster without complications: Secondary | ICD-10-CM

## 2019-02-23 ENCOUNTER — Encounter: Admit: 2019-02-23 | Discharge: 2019-02-23 | Payer: BC Managed Care – PPO

## 2019-02-23 NOTE — Telephone Encounter
Incoming VM from pt's wife, Crystal, stating pt has new PCP, Dr. Shelbie Ammons, and got pneumonia and flu vaccinations 01/14/19.    Outbound call back to Crystal and LVM per filed consent noting message was received and requested call back to confirm OK to request vaccination records from Dr. Marguerite Olea office. Will await call back.

## 2019-03-09 ENCOUNTER — Encounter: Admit: 2019-03-09 | Discharge: 2019-03-09 | Payer: BC Managed Care – PPO

## 2019-04-06 ENCOUNTER — Encounter: Admit: 2019-04-06 | Discharge: 2019-04-06 | Payer: BC Managed Care – PPO

## 2019-05-26 ENCOUNTER — Encounter: Admit: 2019-05-26 | Discharge: 2019-05-26 | Payer: BC Managed Care – PPO

## 2019-07-28 ENCOUNTER — Encounter: Admit: 2019-07-28 | Discharge: 2019-07-28 | Payer: BC Managed Care – PPO

## 2019-11-30 ENCOUNTER — Encounter: Admit: 2019-11-30 | Discharge: 2019-11-30 | Payer: BC Managed Care – PPO

## 2019-11-30 MED ORDER — HUMIRA(CF) PEN 40 MG/0.4 ML SC PNKT
40 mg | PACK | SUBCUTANEOUS | 2 refills | Status: AC
Start: 2019-11-30 — End: ?

## 2019-11-30 NOTE — Telephone Encounter
Called Adekunle Duane Amed Datta's wife, Engineer, manufacturing, to discuss PA request for Humira at [facility]. Per records, patient was previously filling with [facility].     According to [employer], they moved suddenly to [place], where she has started a new job, with new insurance. [facility] has been unable to establish with a new GI at this time, but they are actively working on this. Patient has been without medication for 2 months.     Discussed with GI RN, who texted Dr. Ernst Breach. Plan is to resume Humira injections. Will send new Rx to [facility] to help with PA. Will notify patient's wife once approved.     Mliss Fritz, PHARMD

## 2019-12-01 ENCOUNTER — Encounter: Admit: 2019-12-01 | Discharge: 2019-12-01 | Payer: BC Managed Care – PPO

## 2019-12-04 ENCOUNTER — Encounter: Admit: 2019-12-04 | Discharge: 2019-12-04 | Payer: BC Managed Care – PPO

## 2019-12-04 DIAGNOSIS — K5090 Crohn's disease, unspecified, without complications: Secondary | ICD-10-CM

## 2019-12-04 MED ORDER — HUMIRA(CF) PEN 40 MG/0.4 ML SC PNKT
40 mg | PACK | SUBCUTANEOUS | 2 refills | Status: AC
Start: 2019-12-04 — End: ?

## 2019-12-04 NOTE — Progress Notes
The Prior Authorization for Humira was approved for Tanner Taylor from 12/04/2019 to 12/03/2020. The PA authorization number is [id number].    Tanner Taylor's prescription for Humira is not able to be filled by [facility] due to the patient's prescription insurance.  Pharmacy has contacted Hipolito Bayley to notify them that their prescription will be sent to [facility] [phone]) as required by their prescription insurance.  The specialty pharmacy team has also notified the ambulatory clinical pharmacist by e-mail.    Nena Jordan  Pharmacy Patient Advocate  [phone]

## 2019-12-06 ENCOUNTER — Encounter: Admit: 2019-12-06 | Discharge: 2019-12-06 | Payer: BC Managed Care – PPO

## 2019-12-06 NOTE — Telephone Encounter
Routing to Dr. Clide Cliff to advise.

## 2019-12-09 ENCOUNTER — Encounter: Admit: 2019-12-09 | Discharge: 2019-12-09 | Payer: BC Managed Care – PPO

## 2020-07-31 IMAGING — MR MRI ENTEROGRAPHY
9 of 16 series · 25 of 48 positions shown · non-contrast
Comparison: None 
Multiplanar, multiecho position MR images of the abdomen were performed with and 
without intravenous contrast enhancement.

S[facility] 204 
MRI ENTEROGRAPHY, 07/31/2020 [DATE]: 
Crohn's disease of. Large intestine without complications. 
CLINICAL INDICATION:  History of Crohn's disease diagnosed in 8448.

[Series 101: survey-bh · axial · 15.0mm · 1.76mm/px · 1 of 11 slices shown]
[im 1/11]
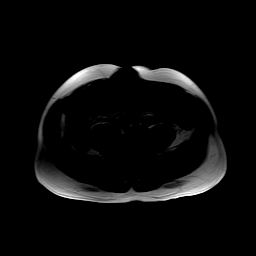

[Series 203: smdixon-in phase · axial · 4.0mm · 0.94mm/px · z∈[-9,+249]mm · 4 of 130 slices shown (1 of 2)]
[im 1/130]
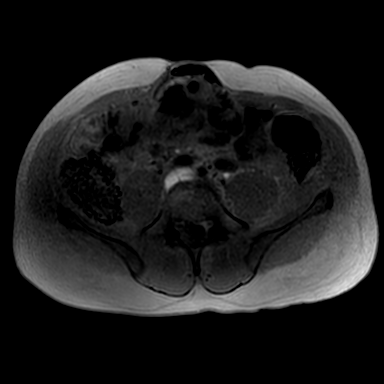
[im 44/130]
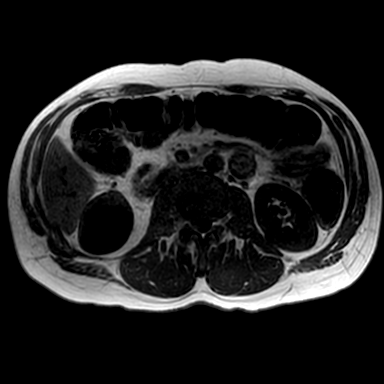
[im 87/130]
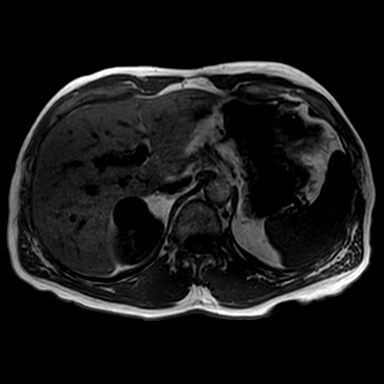
[im 130/130]
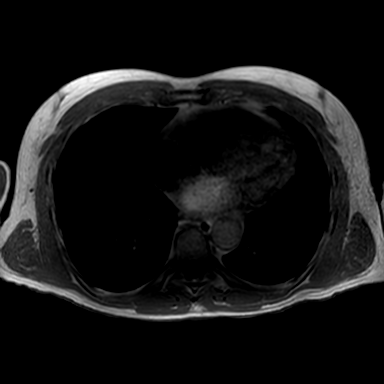

[Series 204: smdixon-out of phase · axial · 4.0mm · 0.94mm/px · z∈[-9,+249]mm · 5 of 130 slices shown]
[im 1/130]
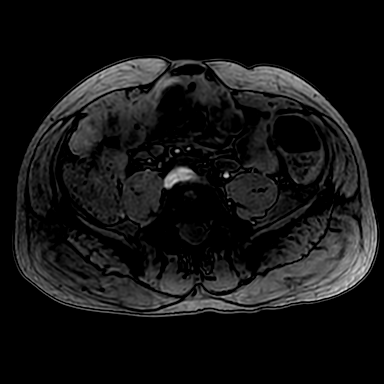
[im 33/130]
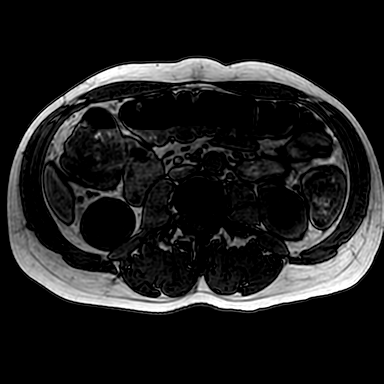
[im 65/130]
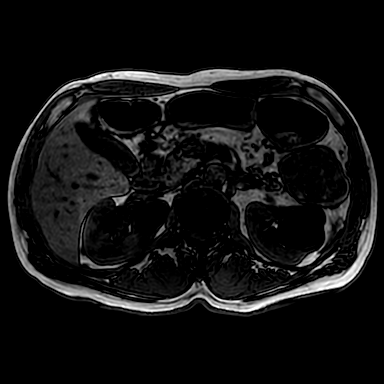
[im 97/130]
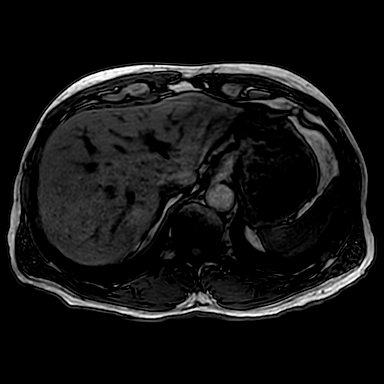
[im 130/130]
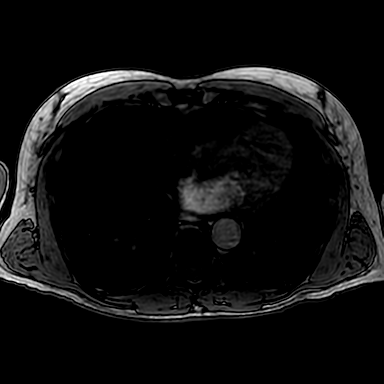

[Series 303: smdixon-in phase · axial · 4.0mm · 0.94mm/px · z∈[-235,+23]mm · 5 of 130 slices shown (2 of 2)]
[im 1/130]
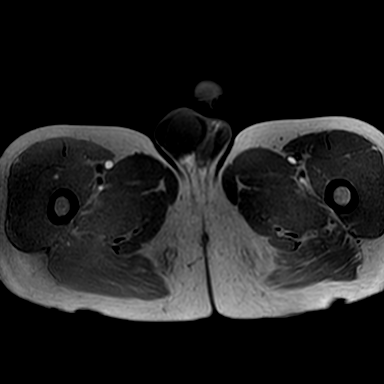
[im 33/130]
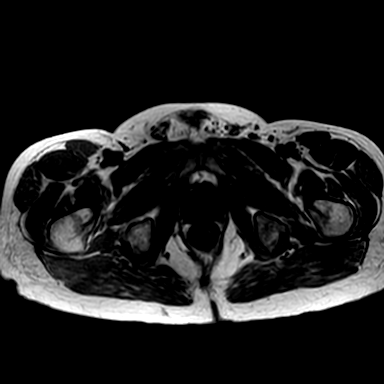
[im 65/130]
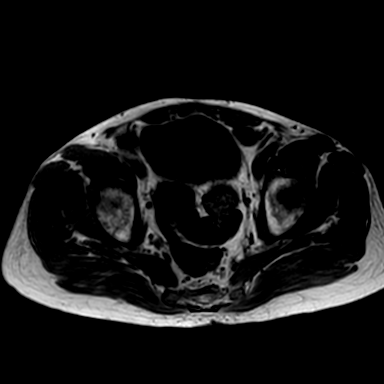
[im 97/130]
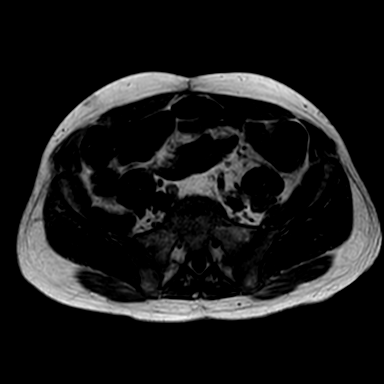
[im 130/130]
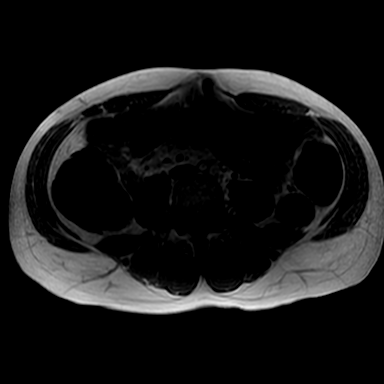

[Series 401: t2w_tse_cor_bh · coronal · 5.5mm · 0.78mm/px · 1 of 36 slices shown]
[im 1/36]
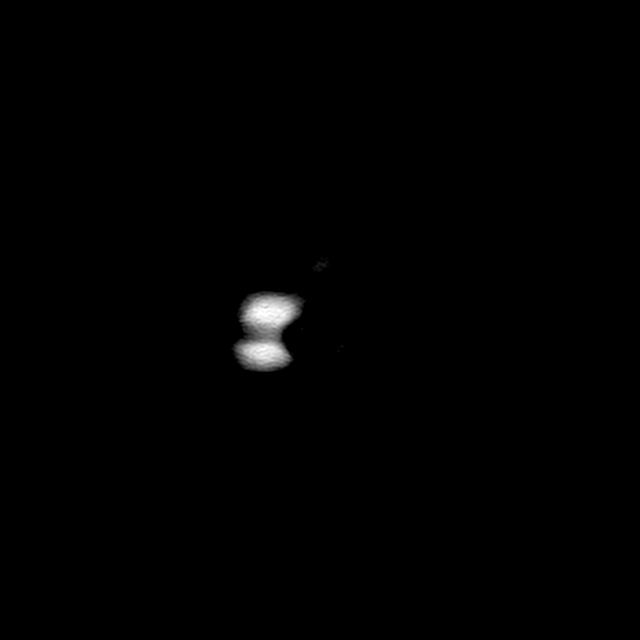

[Series 501: t2w_tse_bh · axial · 5.0mm · 0.75mm/px · z∈[-8,+250]mm · 2 of 44 slices shown (1 of 2)]
[im 1/44]
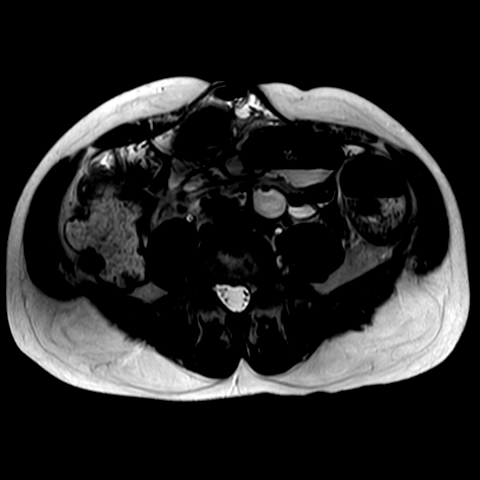
[im 44/44]
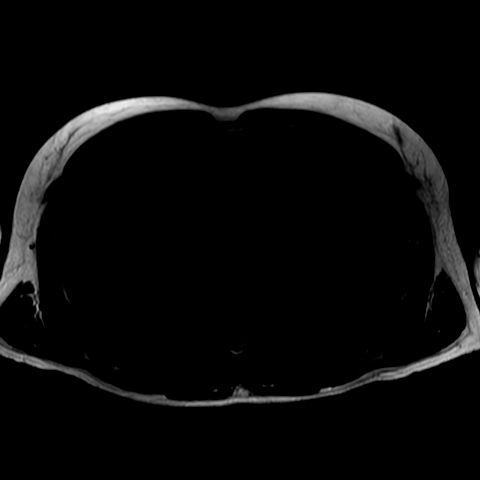

[Series 601: t2w_tse_bh · axial · 5.0mm · 0.75mm/px · z∈[-235,+23]mm · 2 of 44 slices shown (2 of 2)]
[im 1/44]
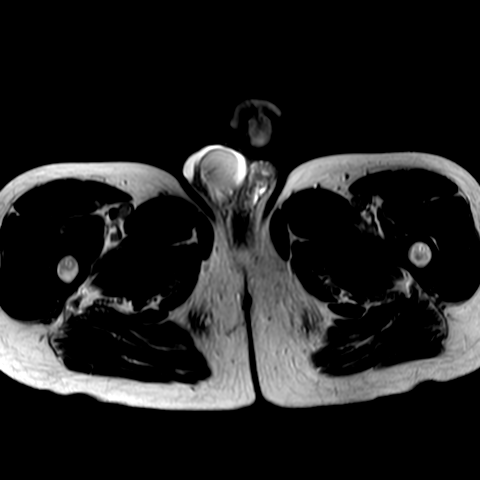
[im 44/44]
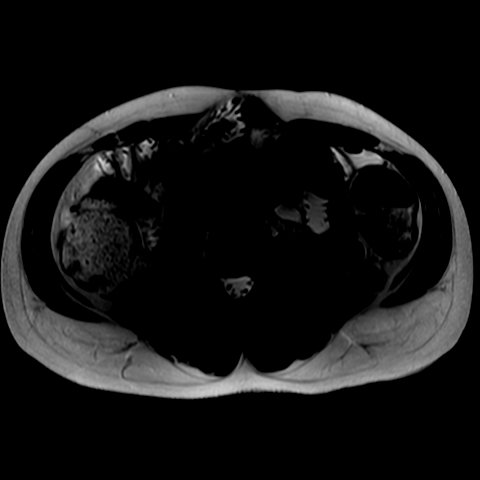

[Series 701: T1 dynamic fat-sat · coronal · non-contrast · 4.0mm · 0.98mm/px · 4 of 125 slices shown]
[im 1/125]
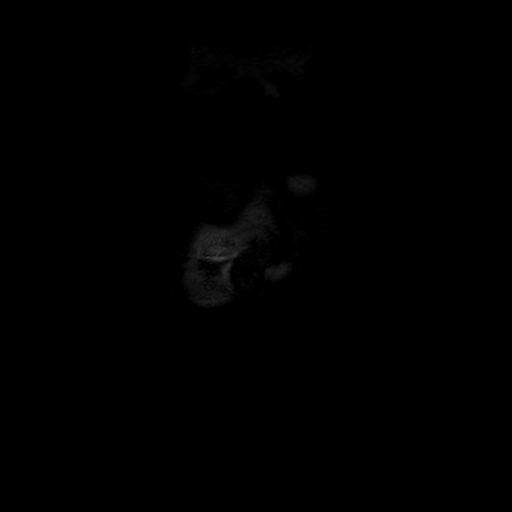
[im 42/125]
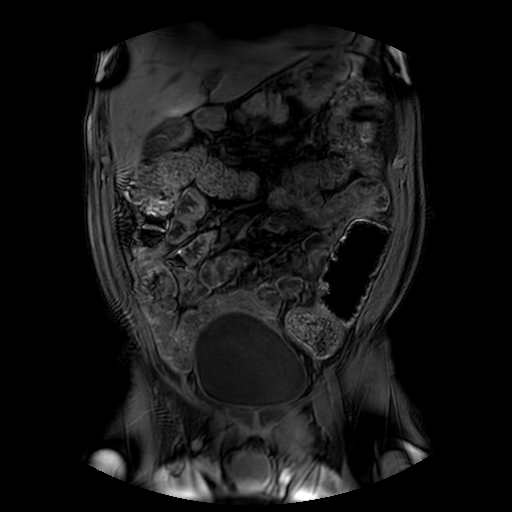
[im 83/125]
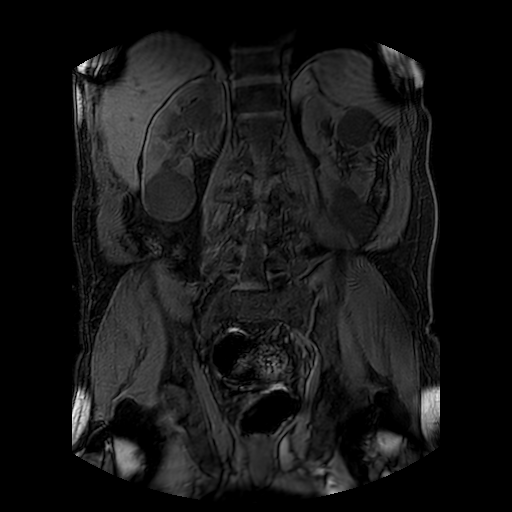
[im 125/125]
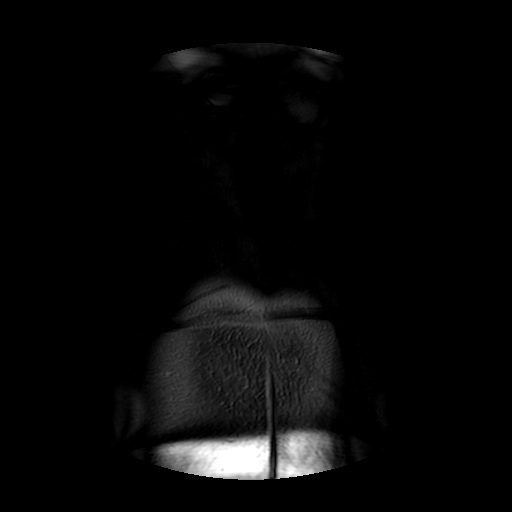

[Series 901: T1 dynamic fat-sat post-contrast · coronal · 4.0mm · 0.98mm/px · 1 of 125 slices shown]
[im 1/125]
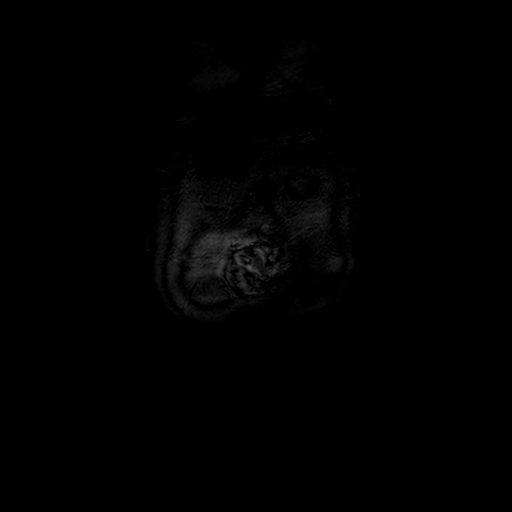

[25 of 48 positions shown; findings below may reference images not displayed]

IV gadolinium contrast using 8 mL of 
Gadavist were administered per protocol. In addition, oral Volumen was ingested 
prior to scanning.  3 bottles of 450 mL were administered prior to scanning at 2 
hours, 1 hour and 30 minutes prior to imaging.
FINDINGS: There is nonspecific bowel wall thickening at the level of the juncture of the 
descending colon and sigmoid. There is some increased mucosal enhancement within 
this area as seen for example on the coronal postcontrast series, series 7787, 
image 51. No additional areas of bowel wall thickening or abnormal enhancement. 
There is distention of the terminal ileum with loss of normal mucosal pattern. 
No discrete fistula. No bowel obstruction. 
Numerous bilateral renal cysts. There are few hepatic cysts. Included portions 
of the lower chest are negative. The spleen, pancreas, biliary tree, 
gallbladder, aorta and cava are negative. Mild lumbar levocurvature and 
spondylotic changes. No avascular necrosis. SI joints appear preserved without 
findings to suggest acute or chronic sacroiliitis.
IMPRESSION: Abnormal appearance of the bowel dilatation of the terminal ileum, loss of 
normal mucosal pattern within the terminal ileum wall thickening with increased 
mucosal enhancement suggestive for a skip lesion within the distal descending 
colon correlates with patient's history of Crohn's disease.

## 2021-08-01 IMAGING — MR MRI ENTEROGRAPHY W/WO CONTRAST
9 of 17 series · 21 of 48 positions shown · non-contrast
Comparison: July 31, 2020 MR enterography.

________________________________________________________________________________________________ 
******** ADDENDUM #1 ********/n 
***CORRECTED REPORT, TECHNIQUE EDITED*** 
Multiplanar, multiecho position MR images of the abdomen and pelvis were 
performed with and without intravenous contrast enhancement 8.5 mL of Gadovist 
was administered per protocol. 1.5 ml of GADOVIST WAS DISCARDED. In addition, 3 
bottles of VOLUMEN 450 mL were administered prior to scanning at 2 hours, 1 hour 
and 30 minutes prior to imaging. Patient was scanned on a 1.5 Tesla magnet. 
MRI ENTEROGRAPHY W/WO CONTRAST, 08/01/2021 [DATE]: 
CLINICAL INDICATION: History of Crohn disease.

[Series 101: survey supine · axial · 15.0mm · 1.76mm/px · 1 of 17 slices shown]
[im 1/17]
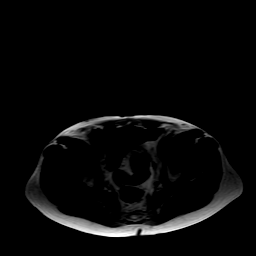

[Series 301: (id) cor bh · coronal · 5.0mm · 0.82mm/px · 1 of 38 slices shown]
[im 1/38]
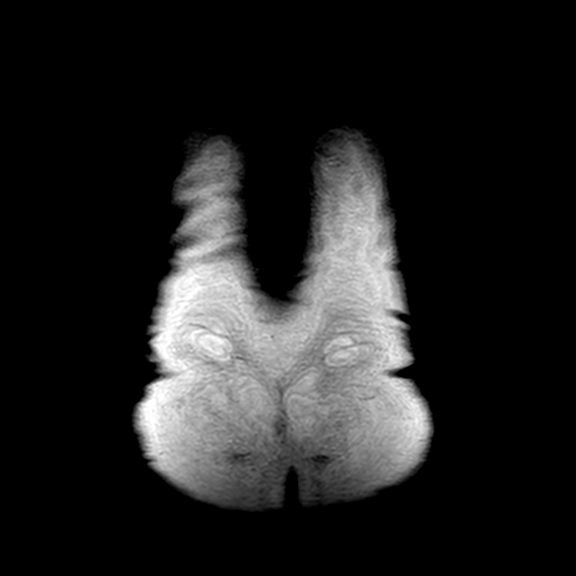

[Series 402: sout of phase · axial · 6.0mm · 1.09mm/px · 1 of 72 slices shown]
[im 1/72]
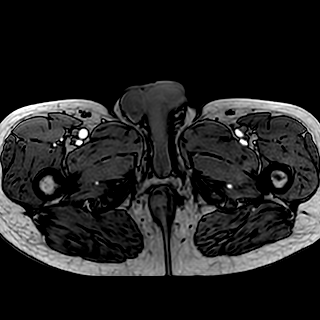

[Series 403: sin phase · axial · 6.0mm · 1.09mm/px · 1 of 72 slices shown]
[im 1/72]
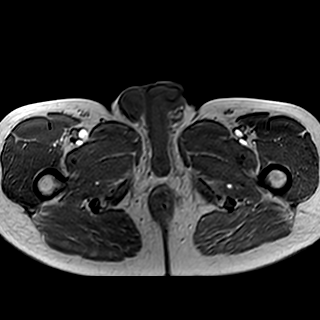

[Series 501: t2_ax_mvxd_hr_rt · axial · 5.0mm · 0.78mm/px · z∈[-183,+291]mm · 2 of 82 slices shown]
[im 1/82]
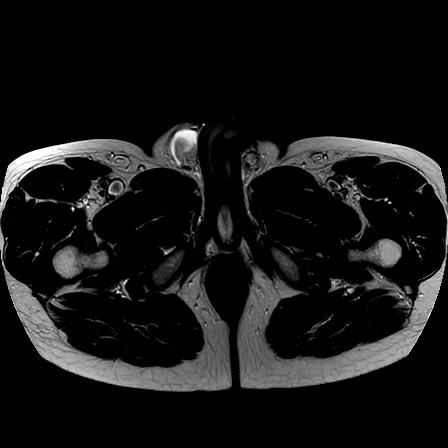
[im 82/82]
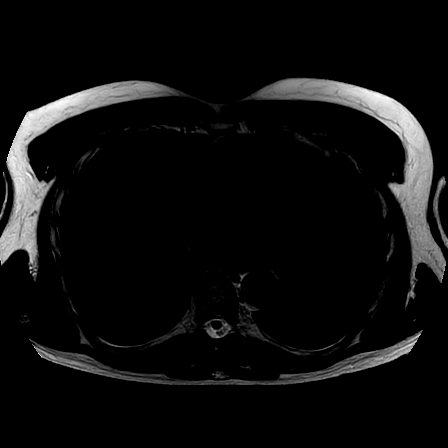

[Series 601: t2_spir_ax_mvxd_rt_fast · axial · 5.0mm · 0.88mm/px · z∈[-183,+279]mm · 2 of 80 slices shown]
[im 1/80]
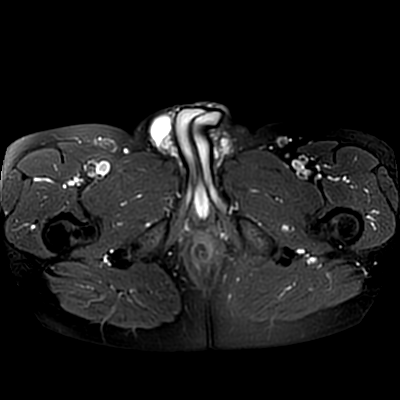
[im 80/80]
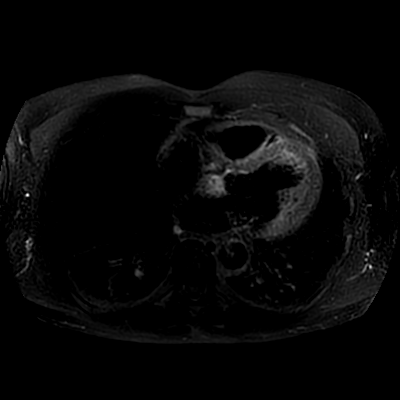

[Series 901: e-thrive_dyn_bh · coronal · 5.0mm · 0.89mm/px · 8 of 450 slices shown]
[im 1/450]
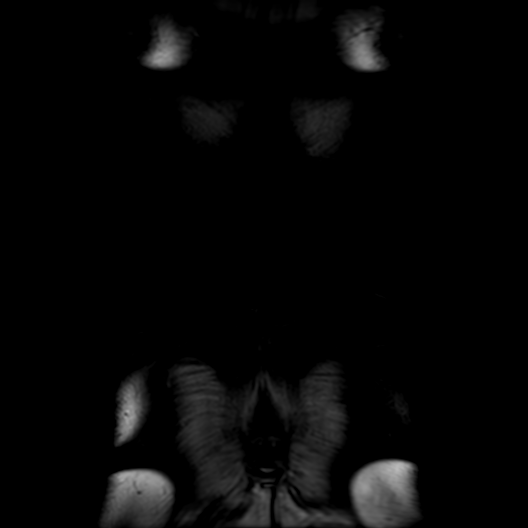
[im 75/450]
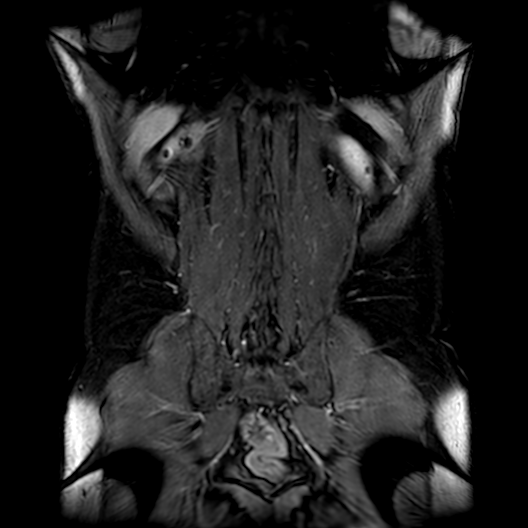
[im 150/450]
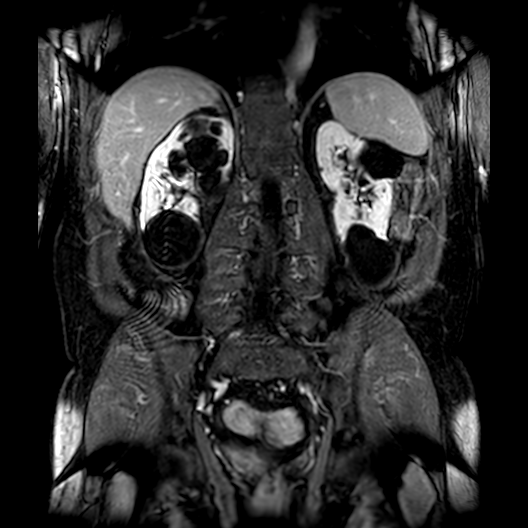
[im 188/450]
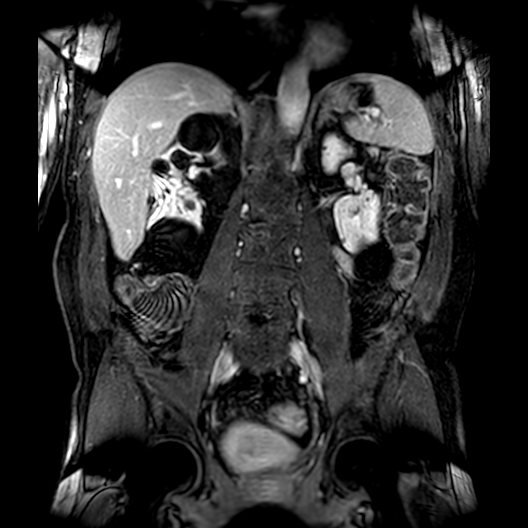
[im 262/450]
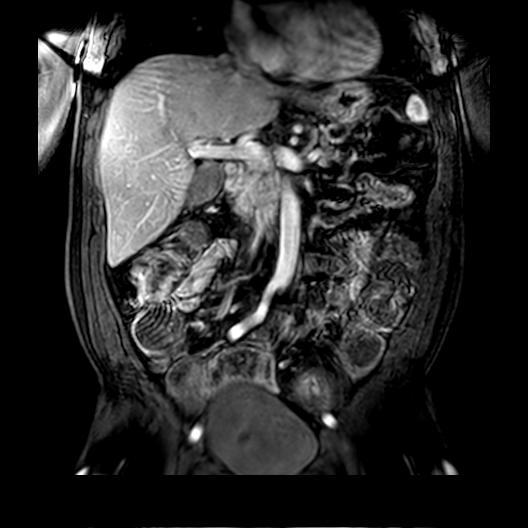
[im 300/450]
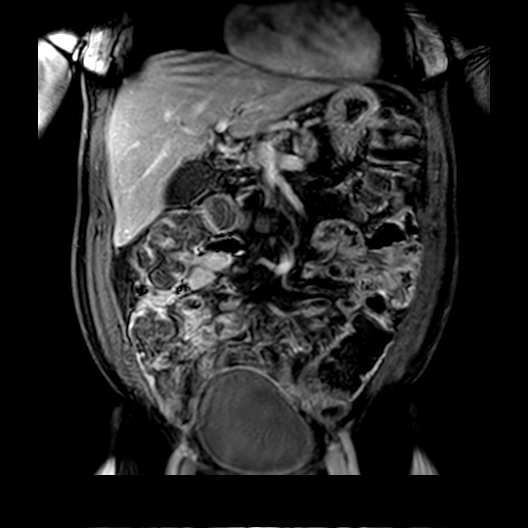
[im 375/450]
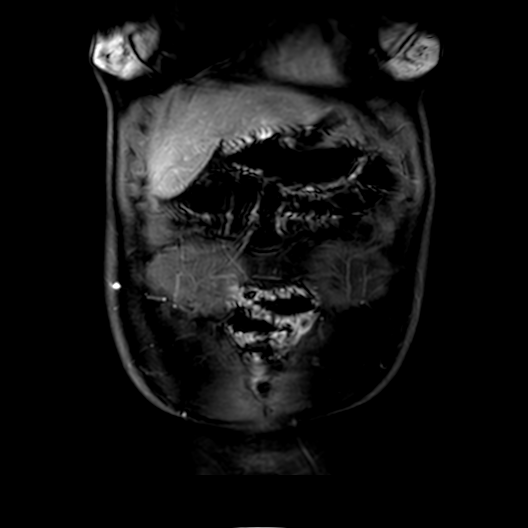
[im 450/450]
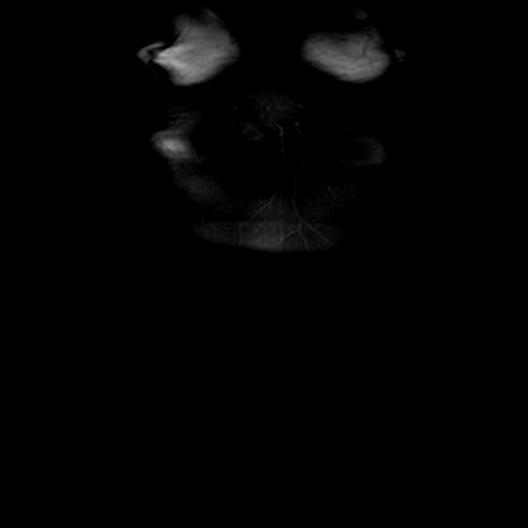

[Series 902: DIXON · coronal · 5.0mm · 0.89mm/px · 3 of 90 slices shown (1 of 2)]
[im 1/90]
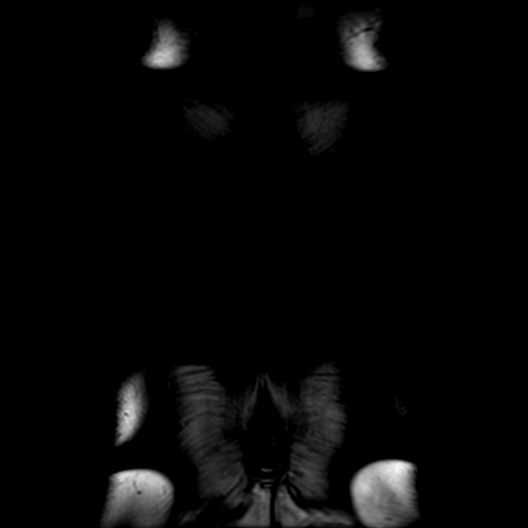
[im 45/90]
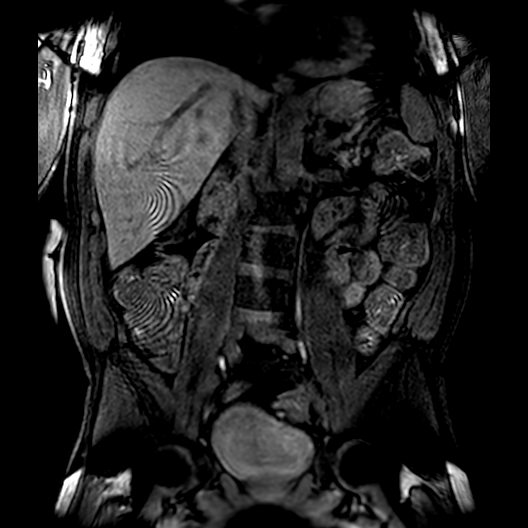
[im 90/90]
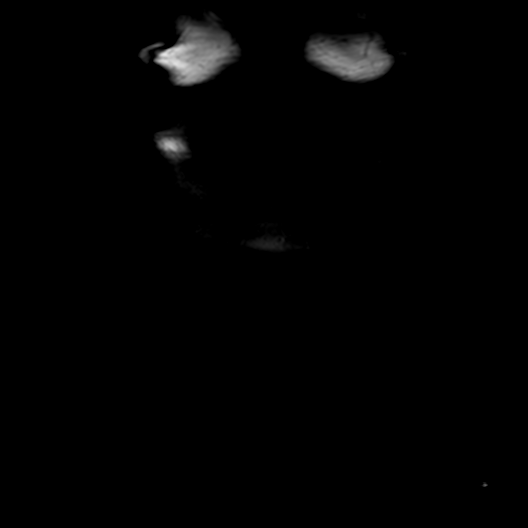

[Series 903: DIXON · coronal · 5.0mm · 0.89mm/px · 2 of 90 slices shown (2 of 2)]
[im 1/90]
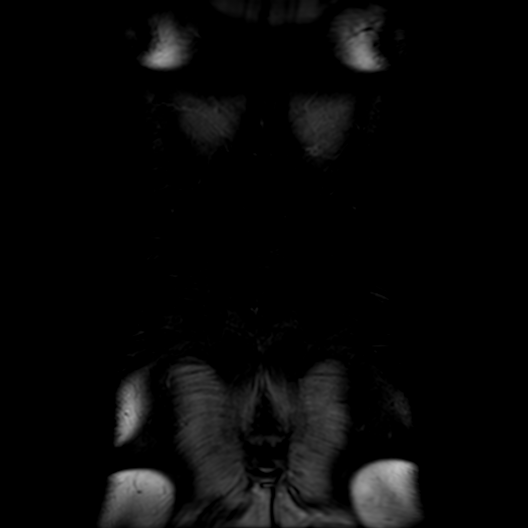
[im 45/90]
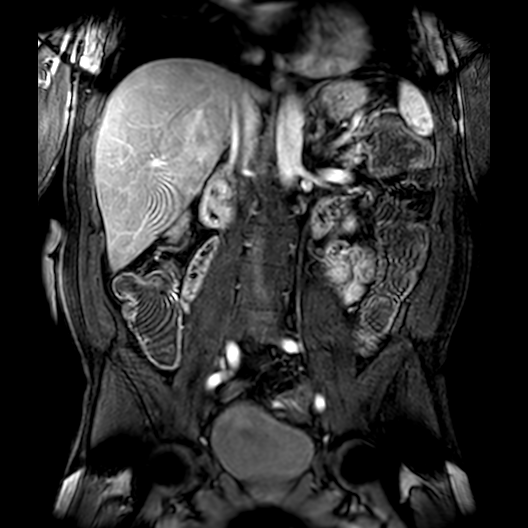

[21 of 48 positions shown; findings below may reference images not displayed]

Multiplanar, multiecho position MR images of the abdomen and pelvis were 
performed with and without intravenous contrast enhancement 8.5 mL of Gadovist 
was administered per protocol. In addition, oral Volumen was ingested prior to 
scanning.  3 bottles of 450 mL were administered prior to scanning at 2 hours, 1 
hour and 30 minutes prior to imaging. Patient was scanned on a 1.5 Tesla magnet.
FINDINGS: The cecum and terminal ileum are thought to be surgically removed. There is 
diffuse wall thickening involving several centimeters of terminal ileum just 
proximal to the ileocolonic surgical anastomosis which is unassociated with 
abnormal enhancement. The other small bowel loops are within normal limits. 
There is slight enhancement of mucosa in the sigmoid colon which is 
substantially diminished in longitudinal extent in relation to the prior exam. 
Proximal to the sigmoid colon there is excessive stool throughout the colon 
suggesting constipation. There is minimal colonic diverticulosis. No evidence 
for abscess or fistula. There is no gastric pathology. The liver contains small 
cysts and is otherwise unremarkable. The gallbladder, biliary tract, pancreas, 
spleen, and adrenal glands are within normal limits. There are numerous 
bilateral simple cysts throughout both kidneys. Urinary bladder is unremarkable. 
Prostate appears mildly enlarged. Abdominal aorta is normal in caliber. No 
visible ascites. There is dehiscence of the Teemus Salom centered upon the 
umbilicus with anterior bulging of intra-abdominal contents.
IMPRESSION: 1. There is mild diffuse wall thickening without associated edema or enhancement 
involving several centimeters of the distal ileum just proximal to the 
ileocolonic anastomosis. Otherwise no active small bowel pathology is found 
elsewhere. 
2. There is mild enhancement of a short segment of sigmoid colon which is 
decreased in longitudinal extent as compared to the prior exam from July 31, 2020. Proximal to the sigmoid colon there is excessive stool throughout the 
colon suggesting constipation. 
3. No free intraperitoneal fluid or evidence for abscess. No evidence for 
fistula.

## 2021-11-07 ENCOUNTER — Encounter: Admit: 2021-11-07 | Discharge: 2021-11-07 | Payer: BC Managed Care – PPO

## 2023-04-15 ENCOUNTER — Encounter: Admit: 2023-04-15 | Discharge: 2023-04-15 | Payer: BC Managed Care – PPO
